# Patient Record
Sex: Male | Born: 1990 | Race: White | Hispanic: No | Marital: Single | Smoking: Never smoker
Health system: Southern US, Community
[De-identification: ages and names within clinical notes are randomized; demographics above are authoritative.]

## PROBLEM LIST (undated history)

## (undated) DIAGNOSIS — F419 Anxiety disorder, unspecified: Secondary | ICD-10-CM

## (undated) DIAGNOSIS — K219 Gastro-esophageal reflux disease without esophagitis: Secondary | ICD-10-CM

## (undated) HISTORY — PX: TONSILLECTOMY: SUR1361

## (undated) HISTORY — PX: NO PAST SURGERIES: SHX2092

---
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
## 2011-06-22 ENCOUNTER — Ambulatory Visit: Payer: Self-pay | Admitting: Internal Medicine

## 2011-06-22 LAB — RAPID STREP-A WITH REFLX: Micro Text Report: NEGATIVE

## 2011-06-24 LAB — BETA STREP CULTURE(ARMC)

## 2015-01-18 DIAGNOSIS — Z87898 Personal history of other specified conditions: Secondary | ICD-10-CM | POA: Insufficient documentation

## 2015-01-18 DIAGNOSIS — R0789 Other chest pain: Secondary | ICD-10-CM | POA: Insufficient documentation

## 2016-05-23 ENCOUNTER — Other Ambulatory Visit: Payer: Self-pay | Admitting: Family Medicine

## 2016-05-23 DIAGNOSIS — R748 Abnormal levels of other serum enzymes: Secondary | ICD-10-CM

## 2016-05-29 ENCOUNTER — Ambulatory Visit
Admission: RE | Admit: 2016-05-29 | Discharge: 2016-05-29 | Disposition: A | Discharge status: Home / Self Care | Payer: BLUE CROSS/BLUE SHIELD | Source: Ambulatory Visit | Attending: Family Medicine | Admitting: Family Medicine

## 2016-05-29 DIAGNOSIS — R748 Abnormal levels of other serum enzymes: Secondary | ICD-10-CM | POA: Insufficient documentation

## 2016-05-29 DIAGNOSIS — R932 Abnormal findings on diagnostic imaging of liver and biliary tract: Secondary | ICD-10-CM | POA: Diagnosis not present

## 2017-03-28 ENCOUNTER — Encounter: Payer: Self-pay | Admitting: Gastroenterology

## 2017-03-28 ENCOUNTER — Ambulatory Visit: Payer: BLUE CROSS/BLUE SHIELD | Admitting: Gastroenterology

## 2017-03-28 ENCOUNTER — Other Ambulatory Visit: Payer: Self-pay

## 2017-03-28 ENCOUNTER — Encounter (INDEPENDENT_AMBULATORY_CARE_PROVIDER_SITE_OTHER): Payer: Self-pay

## 2017-03-28 VITALS — BP 175/90 | HR 93 | Temp 97.8°F | Ht 76.0 in | Wt 248.0 lb

## 2017-03-28 DIAGNOSIS — R748 Abnormal levels of other serum enzymes: Secondary | ICD-10-CM | POA: Diagnosis not present

## 2017-03-28 NOTE — Progress Notes (Signed)
Gastroenterology Consultation  Referring Provider:     Marina GoodellFeldpausch, Dale E, MD Primary Care Physician:  Marina GoodellFeldpausch, Dale E, MD Primary Gastroenterologist:  Dr. Servando SnareWohl     Reason for Consultation:     Abnormal liver enzymes        HPI:   Jonathon Norris is a 26 y.o. y/o male referred for consultation & management of abnormal liver enzymes by Dr. Maryjane HurterFeldpausch, Madaline Guthrieale E, MD.  This patient was [facility] and was found to have abnormal liver enzymes. The patient's AST was higher than the ALT with the patient's AST being over 500. The patient was also found to have bilirubin that was over 2. The GI department was consult today and recommended the patient abstain from alcohol prior to having any further liver enzymes tested. The patient's acute hepatitis panel was negative for hepatitis B and C. The patient's hepatitis C was also negative. The patient's platelets have been normal and his albumin was not found to be low. The patient's hepatitis B surface antibody was positive conferring immunity. The patient had an ultrasound that showed increased echogenicity consistent with fatty liver. The patient had gone to the ER because of an anxiety attack. The patient had also had some vomiting prior to [facility]. The patient reports he was drinking a half-gallon of vodka a week and stop doing that.  The patient continues to drink 6-8 beers a day.  No past medical history on file.  No past surgical history on file.  Prior to Admission medications   Not on File    No family history on file.   Social History   Tobacco Use  . Smoking status: Not on file  Substance Use Topics  . Alcohol use: Not on file  . Drug use: Not on file    Allergies as of 03/28/2017  . (Not on File)    Review of Systems:    All systems reviewed and negative except where noted in HPI.   Physical Exam:  There were no vitals taken for this visit. No LMP for male patient. Psych:  Alert and cooperative.  Normal mood and affect. General:   Alert,  Well-developed, well-nourished, pleasant and cooperative in NAD Head:  Normocephalic and atraumatic. Eyes:  Sclera clear, no icterus.   Conjunctiva pink. Ears:  Normal auditory acuity. Nose:  No deformity, discharge, or lesions. Mouth:  No deformity or lesions,oropharynx pink & moist. Neck:  Supple; no masses or thyromegaly. Lungs:  Respirations even and unlabored.  Clear throughout to auscultation.   No wheezes, crackles, or rhonchi. No acute distress. Heart:  Regular rate and rhythm; no murmurs, clicks, rubs, or gallops. Abdomen:  Normal bowel sounds.  No bruits.  Soft, non-tender and non-distended without masses, hepatosplenomegaly or hernias noted.  No guarding or rebound tenderness.  Negative Carnett sign.   Rectal:  Deferred.  Msk:  Symmetrical without gross deformities.  Good, equal movement & strength bilaterally. Pulses:  Normal pulses noted. Extremities:  No clubbing or edema.  No cyanosis. Neurologic:  Alert and oriented x3;  grossly normal neurologically. Skin:  Intact without significant lesions or rashes.  No jaundice. Lymph Nodes:  No significant cervical adenopathy. Psych:  Alert and cooperative. Normal mood and affect.  Imaging Studies: No results found.  Assessment and Plan:   Jonathon JockRyan T Seydel is a 26 y.o. y/o male who comes in after being found to have abnormal liver enzymes.  The patient's most recent liver enzymes  have come down but he continues to drink. The patient was drinking a half of a gallon of vodka per week and 6-8 beers a day.  The patient has quit the vodka but continues to drink beer.  The patient has been told that he needs to stop all of his alcohol consumption and have a repeat blood test for his liver enzymes in 3 months.  The patient will also have his liver enzymes checked for other possible causes of abnormal liver enzymes in case he has something other than alcoholic hepatitis.  The patient has been explained the  plan and agrees with it.  Midge Miniumarren Eriona Kinchen, MD. Clementeen GrahamFACG   Note: This dictation was prepared with Dragon dictation along with smaller phrase technology. Any transcriptional errors that result from this process are unintentional.

## 2017-03-28 NOTE — Patient Instructions (Addendum)
You are scheduled for a RUQ abdominal US at [facility] on Nov. 13th @8 :45am. Please arrive at 8:30am. You cannot have anything to eat or drink after midnight.   If you need to reschedule this appointment for any reason, please contact central scheduling at [phone].

## 2017-03-29 LAB — ALPHA-1-ANTITRYPSIN: A-1 Antitrypsin: 192 mg/dL (ref 90–200)

## 2017-03-29 LAB — CERULOPLASMIN: CERULOPLASMIN: 27.6 mg/dL (ref 16.0–31.0)

## 2017-03-29 LAB — MITOCHONDRIAL ANTIBODIES: MITOCHONDRIAL AB: 3.6 U (ref 0.0–20.0)

## 2017-03-29 LAB — HEPATIC FUNCTION PANEL
ALBUMIN: 4.9 g/dL (ref 3.5–5.5)
ALK PHOS: 122 IU/L — AB (ref 39–117)
ALT: 155 IU/L — ABNORMAL HIGH (ref 0–44)
AST: 178 IU/L — AB (ref 0–40)
BILIRUBIN TOTAL: 0.9 mg/dL (ref 0.0–1.2)
Bilirubin, Direct: 0.3 mg/dL (ref 0.00–0.40)
Total Protein: 7.9 g/dL (ref 6.0–8.5)

## 2017-03-29 LAB — ANA: ANA: NEGATIVE

## 2017-03-29 LAB — ANTI-SMOOTH MUSCLE ANTIBODY, IGG: Smooth Muscle Ab: 10 Units (ref 0–19)

## 2017-04-01 ENCOUNTER — Telehealth: Payer: Self-pay

## 2017-04-01 NOTE — Telephone Encounter (Signed)
Pt notified of lab results

## 2017-04-01 NOTE — Telephone Encounter (Signed)
-----   Message from Midge Miniumarren Wohl, MD sent at 04/01/2017  2:01 PM EST ----- Let the patient know that his liver enzymes are still significantly elevated and he should have them checked again in a month after complete stopping of his drinking.

## 2017-04-02 ENCOUNTER — Ambulatory Visit
Admission: RE | Admit: 2017-04-02 | Discharge: 2017-04-02 | Disposition: A | Discharge status: Home / Self Care | Payer: BLUE CROSS/BLUE SHIELD | Source: Ambulatory Visit | Attending: Gastroenterology | Admitting: Gastroenterology

## 2017-04-02 DIAGNOSIS — K760 Fatty (change of) liver, not elsewhere classified: Secondary | ICD-10-CM | POA: Insufficient documentation

## 2017-04-02 DIAGNOSIS — R748 Abnormal levels of other serum enzymes: Secondary | ICD-10-CM | POA: Diagnosis not present

## 2017-04-08 ENCOUNTER — Telehealth: Payer: Self-pay | Admitting: Gastroenterology

## 2017-04-08 ENCOUNTER — Telehealth: Payer: Self-pay

## 2017-04-08 NOTE — Telephone Encounter (Signed)
Pt notified of US results.  

## 2017-04-08 NOTE — Telephone Encounter (Signed)
-----   Message from Midge Miniumarren Wohl, MD sent at 04/02/2017  6:00 PM EST ----- Left patient know that his ultrasound showed fatty liver which can be reversible in time.  No sign of cirrhosis or masses.

## 2017-04-08 NOTE — Telephone Encounter (Signed)
Patient is calling for lab results.

## 2018-05-30 ENCOUNTER — Other Ambulatory Visit: Payer: Self-pay

## 2018-05-30 ENCOUNTER — Ambulatory Visit
Admission: EM | Admit: 2018-05-30 | Discharge: 2018-05-30 | Disposition: A | Discharge status: Home / Self Care | Payer: Managed Care, Other (non HMO) | Attending: Family Medicine | Admitting: Family Medicine

## 2018-05-30 ENCOUNTER — Ambulatory Visit (INDEPENDENT_AMBULATORY_CARE_PROVIDER_SITE_OTHER): Payer: Managed Care, Other (non HMO)

## 2018-05-30 ENCOUNTER — Encounter: Payer: Self-pay | Admitting: Emergency Medicine

## 2018-05-30 DIAGNOSIS — M25572 Pain in left ankle and joints of left foot: Secondary | ICD-10-CM

## 2018-05-30 HISTORY — DX: Anxiety disorder, unspecified: F41.9

## 2018-05-30 MED ORDER — MELOXICAM 15 MG PO TABS: 15.0000 mg | ORAL_TABLET | Freq: Every day | ORAL | 0 refills | Status: DC | PRN

## 2018-05-30 NOTE — ED Triage Notes (Signed)
Patient c/o left ankle pain after twisting his ankle on 04/3018.

## 2018-05-30 NOTE — Discharge Instructions (Signed)
No fracture.  Medication as prescribed.  Wear brace.  If persists, see podiatry at [facility].

## 2018-05-30 NOTE — ED Provider Notes (Signed)
[facility]    CSN: [id number] Arrival date & time: 05/30/18  1159  History   Chief Complaint Chief Complaint  Patient presents with  . Ankle Pain    left   HPI  28 year old male presents with a left ankle injury.  Patient states that he twisted his ankle on 12/30.  Patient states that he had an eversion injury while lifting furniture at work.  Patient reports that his pain is located on the medial aspect of his left ankle.  He has been able to work.  He did take some time off to treat this conservatively in hopes that it would improve.  He returned to work earlier this week and had difficulty doing he is work secondary to pain.  Worse with certain activities.  Also worse early in the morning until he gets going.  Stiff.  Decreased range of motion.  No reports of swelling.  No bruising.  No other associated symptoms.  He has been using RICE without resolution.  No other complaints.  PMH, Surgical Hx, Family Hx, Social History reviewed and updated as below.  Past Medical History:  Diagnosis Date  . Anxiety    Patient Active Problem List   Diagnosis Date Noted  . Atypical chest pain 01/18/2015  . History of tachycardia 01/18/2015   History reviewed. No pertinent surgical history.  Home Medications    Prior to Admission medications   Medication Sig Start Date End Date Taking? Authorizing Provider  pantoprazole (PROTONIX) 40 MG tablet  03/21/17  Yes [provider]  sertraline (ZOLOFT) 100 MG tablet  03/21/17  Yes [provider]  meloxicam (MOBIC) 15 MG tablet Take 1 tablet (15 mg total) by mouth daily as needed. 05/30/18   Tommie Sams, DO   Family History Family History  Problem Relation Age of Onset  . Healthy Mother   . Healthy Father    Social History Social History   Tobacco Use  . Smoking status: Never Smoker  . Smokeless tobacco: Current User    Types: Snuff  Substance Use Topics  . Alcohol use: Yes  . Drug use: No   Allergies    Amoxicillin  Review of Systems Review of Systems  Constitutional: Negative.   Musculoskeletal:       Left ankle pain.    Physical Exam Triage Vital Signs ED Triage Vitals  Enc Vitals Group     BP 05/30/18 1225 (!) 139/97     Pulse Rate 05/30/18 1225 73     Resp 05/30/18 1225 16     Temp 05/30/18 1225 98 F (36.7 C)     Temp Source 05/30/18 1225 Oral     SpO2 05/30/18 1225 100 %     Weight 05/30/18 1221 240 lb (108.9 kg)     Height 05/30/18 1221 6\' 4"  (1.93 m)     Head Circumference --      Peak Flow --      Pain Score 05/30/18 1221 7     Pain Loc --      Pain Edu? --      Excl. in GC? --    Updated Vital Signs BP (!) 139/97 (BP Location: Left Arm)   Pulse 73   Temp 98 F (36.7 C) (Oral)   Resp 16   Ht 6\' 4"  (1.93 m)   Wt 108.9 kg   SpO2 100%   BMI 29.21 kg/m   Visual Acuity Right Eye Distance:   Left Eye Distance:  Bilateral Distance:    Right Eye Near:   Left Eye Near:    Bilateral Near:     Physical Exam Vitals signs and nursing note reviewed.  Constitutional:      General: He is not in acute distress. HENT:     Head: Normocephalic and atraumatic.  Cardiovascular:     Rate and Rhythm: Normal rate and regular rhythm.  Pulmonary:     Effort: Pulmonary effort is normal.     Breath sounds: No wheezing, rhonchi or rales.  Musculoskeletal:     Comments: Left ankle -tenderness around the medial malleolus. No lateral tenderness.  No visible swelling.  No bruising.  Neurological:     Mental Status: He is alert.  Psychiatric:        Mood and Affect: Mood normal.        Behavior: Behavior normal.    UC Treatments / Results  Labs (all labs ordered are listed, but only abnormal results are displayed) Labs Reviewed - No data to display  EKG None  Radiology Dg Ankle Complete Left  Result Date: 05/30/2018 CLINICAL DATA:  Pain after rolling injury EXAM: LEFT ANKLE COMPLETE - 3+ VIEW COMPARISON:  None. FINDINGS: Frontal, oblique, and lateral views  were obtained. No evident fracture or joint effusion. The joint spaces appear unremarkable. No erosive change. Ankle mortise appears intact. IMPRESSION: No fracture or evident arthropathy.  Ankle mortise appears intact. Electronically Signed   By: Bretta BangWilliam  Woodruff III M.D.   On: 05/30/2018 12:40    Procedures Procedures (including critical care time)  Medications Ordered in UC Medications - No data to display  Initial Impression / Assessment and Plan / UC Course  I have reviewed the triage vital signs and the nursing notes.  Pertinent labs & imaging results that were available during my care of the patient were reviewed by me and considered in my medical decision making (see chart for details).    28 year old male presents with left ankle pain.  No evidence of fracture.  X-ray was negative.  Placed in a ankle brace for comfort.  Meloxicam as directed.  Final Clinical Impressions(s) / UC Diagnoses   Final diagnoses:  Acute left ankle pain     Discharge Instructions     No fracture.  Medication as prescribed.  Wear brace.  If persists, [facility].   ED Prescriptions    Medication Sig Dispense Auth. Provider   meloxicam (MOBIC) 15 MG tablet Take 1 tablet (15 mg total) by mouth daily as needed. 30 tablet Tommie Samsook, Rhegan Trunnell G, DO     Controlled Substance Prescriptions [place] Controlled Substance Registry consulted? Not Applicable   Tommie SamsCook, Amparo Donalson G, DO 05/30/18 1418

## 2018-07-16 ENCOUNTER — Ambulatory Visit
Admission: EM | Admit: 2018-07-16 | Discharge: 2018-07-16 | Disposition: A | Discharge status: Home / Self Care | Payer: Managed Care, Other (non HMO) | Attending: Family Medicine | Admitting: Family Medicine

## 2018-07-16 ENCOUNTER — Encounter: Payer: Self-pay | Admitting: Emergency Medicine

## 2018-07-16 ENCOUNTER — Other Ambulatory Visit: Payer: Self-pay

## 2018-07-16 ENCOUNTER — Ambulatory Visit (INDEPENDENT_AMBULATORY_CARE_PROVIDER_SITE_OTHER): Payer: Managed Care, Other (non HMO)

## 2018-07-16 DIAGNOSIS — J069 Acute upper respiratory infection, unspecified: Secondary | ICD-10-CM

## 2018-07-16 DIAGNOSIS — R0981 Nasal congestion: Secondary | ICD-10-CM

## 2018-07-16 DIAGNOSIS — R05 Cough: Secondary | ICD-10-CM

## 2018-07-16 DIAGNOSIS — J0100 Acute maxillary sinusitis, unspecified: Secondary | ICD-10-CM

## 2018-07-16 DIAGNOSIS — M79672 Pain in left foot: Secondary | ICD-10-CM

## 2018-07-16 DIAGNOSIS — S93602A Unspecified sprain of left foot, initial encounter: Secondary | ICD-10-CM | POA: Diagnosis not present

## 2018-07-16 MED ORDER — DOXYCYCLINE HYCLATE 100 MG PO CAPS: 100.0000 mg | ORAL_CAPSULE | Freq: Two times a day (BID) | ORAL | 0 refills | Status: DC

## 2018-07-16 NOTE — Discharge Instructions (Signed)
Take medication as prescribed as discussed. Rest. Drink plenty of fluids. Continue supportive care.   Follow up with your primary care physician this week as needed. Return to Urgent care for new or worsening concerns.

## 2018-07-16 NOTE — ED Provider Notes (Signed)
[facility] ____________________________________________  Time seen: Approximately 3:40 PM  I have reviewed the triage vital signs and the nursing notes.   HISTORY  Chief Complaint Cough (appt) and Foot Pain (left)   HPI Jonathon Norris is a 28 y.o. male presenting for evaluation of 1 week of runny nose, nasal congestion, intermittent sinus pressure and some coughing.  Denies known fevers.  Denies sore throat.  States sinus pressure is currently mild in bilateral cheeks.  Denies ear pain.  Sick contacts at work.  Has been taken over-the-counter Alka-Seltzer and vitamin C which does tend to help some.  Denies chest pain, shortness of breath or abdominal pain.  Denies other aggravating leaving factors.  Patient also reports 2 months ago he accidentally internally rotated his left ankle and was seen in urgent care.  States that time the x-ray was negative.  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  States his ankle is feeling much better, but started to notice some pain in the middle of his left foot and his left toe intermittently.  Denies any other injury.  Has continued remain ambulatory.  States minimal pain to left foot currently.  States pain does fully resolve with ibuprofen and rest.  Denies pain radiation, paresthesias, or other pain.  Denies other complaints.  Marina Goodell, MD: PCP   Past Medical History:  Diagnosis Date  . Anxiety   . Anxiety     Patient Active Problem List   Diagnosis Date Noted  . Atypical chest pain 01/18/2015  . History of tachycardia 01/18/2015    Past Surgical History:  Procedure Laterality Date  . NO PAST SURGERIES       No current facility-administered medications for this encounter.   Current Outpatient Medications:  .  pantoprazole (PROTONIX) 40 MG tablet, , Disp: , Rfl: 1 .  sertraline (ZOLOFT) 100 MG tablet, , Disp: , Rfl: 1 .  doxycycline (VIBRAMYCIN) 100 MG  capsule, Take 1 capsule (100 mg total) by mouth 2 (two) times daily., Disp: 20 capsule, Rfl: 0  Allergies Amoxicillin  Family History  Problem Relation Age of Onset  . Healthy Mother   . Healthy Father     Social History Social History   Tobacco Use  . Smoking status: Never Smoker  . Smokeless tobacco: Current User    Types: Snuff  Substance Use Topics  . Alcohol use: Yes  . Drug use: No    Review of Systems Constitutional: No fever ENT: No sore throat.  Positive nasal congestion. Cardiovascular: Denies chest pain. Respiratory: Denies shortness of breath. Gastrointestinal: No abdominal pain.   Musculoskeletal: Negative for back pain.  Positive left foot pain as above. Skin: Negative for rash.   ____________________________________________   PHYSICAL EXAM:  VITAL SIGNS: ED Triage Vitals  Enc Vitals Group     BP 07/16/18 1248 (!) 146/112     Pulse Rate 07/16/18 1248 81     Resp 07/16/18 1248 18     Temp 07/16/18 1248 98.7 F (37.1 C)     Temp Source 07/16/18 1248 Oral     SpO2 07/16/18 1248 100 %     Weight 07/16/18 1244 245 lb (111.1 kg)     Height 07/16/18 1244 6\' 3"  (1.905 m)     Head Circumference --      Peak Flow --      Pain Score 07/16/18 1244 0  Pain Loc --      Pain Edu? --      Excl. in GC? --     Constitutional: Alert and oriented. Well appearing and in no acute distress. Eyes: Conjunctivae are normal.  Head: Atraumatic.Mild to moderate tenderness to palpation bilateral maxillary sinuses.  No frontal sinus tenderness palpation.  No swelling. No erythema.   Ears: no erythema, normal TMs bilaterally.   Nose: nasal congestion with bilateral nasal turbinate erythema and edema.   Mouth/Throat: Mucous membranes are moist.  Oropharynx non-erythematous.No tonsillar swelling or exudate.  Neck: No stridor.  No cervical spine tenderness to palpation. Hematological/Lymphatic/Immunilogical: No cervical lymphadenopathy. Cardiovascular: Normal rate,  regular rhythm. Grossly normal heart sounds.  Good peripheral circulation. Respiratory: Normal respiratory effort.  No retractions. No wheezes, rales or rhonchi. Good air movement.  Musculoskeletal:  Bilateral pedal pulses equal and easily palpated.  Steady gait. Except: Left ankle nontender, left medial dorsal foot to left first MTP joint mild tenderness direct palpation, slight edema, no ecchymosis, skin intact, full range of motion present to left foot and ankle, normal sensation capillary refill, no erythema.  Left lower extremity otherwise nontender.  No lower extremity edema noted bilaterally. Neurologic:  Normal speech and language. No gross focal neurologic deficits are appreciated. No gait instability. Skin:  Skin is warm, dry and intact. No rash noted. Psychiatric: Mood and affect are normal. Speech and behavior are normal. ___________________________________________   LABS (all labs ordered are listed, but only abnormal results are displayed)  Labs Reviewed - No data to display  RADIOLOGY  Dg Foot Complete Left  Result Date: 07/16/2018 CLINICAL DATA:  Left foot pain after injury 2 months ago. EXAM: LEFT FOOT - COMPLETE 3+ VIEW COMPARISON:  None. FINDINGS: There is no evidence of fracture or dislocation. There is no evidence of arthropathy or other focal bone abnormality. Soft tissues are unremarkable. IMPRESSION: Negative. Electronically Signed   By: Lupita Raider, M.D.   On: 07/16/2018 13:37   ____________________________________________   PROCEDURES Procedures    INITIAL IMPRESSION / ASSESSMENT AND PLAN / ED COURSE  Pertinent labs & imaging results that were available during my care of the patient were reviewed by me and considered in my medical decision making (see chart for details).  Well-appearing patient.  No acute distress.  Suspect recent viral upper respiratory infection, possible sinusitis.  Encourage continue over-the-counter supportive care for 2-3 more days,  Rx hardcopy doxycycline given to begin if symptoms continue.  Reevaluation for worsening complaints.  Left foot x-ray as above per radiologist, negative.  Suspect sprain injury.  Continue supportive care, follow-up with podiatry as needed.Discussed indication, risks and benefits of medications with patient.  Discussed follow up with Primary care physician this week. Discussed follow up and return parameters including no resolution or any worsening concerns. Patient verbalized understanding and agreed to plan.   ____________________________________________   FINAL CLINICAL IMPRESSION(S) / ED DIAGNOSES  Final diagnoses:  Upper respiratory tract infection, unspecified type  Acute maxillary sinusitis, recurrence not specified  Foot sprain, left, initial encounter     ED Discharge Orders         Ordered    doxycycline (VIBRAMYCIN) 100 MG capsule  2 times daily,   Status:  Discontinued     07/16/18 1410    doxycycline (VIBRAMYCIN) 100 MG capsule  2 times daily     07/16/18 1412           Note: This dictation was prepared with Dragon dictation along with smaller  Lobbyist. Any transcriptional errors that result from this process are unintentional.         Renford Dills, NP 07/16/18 1715

## 2018-07-16 NOTE — ED Triage Notes (Signed)
Pt c/o productive cough, chest congestion, sinus congestion. Started about a week ago. Cough is keeping him up at night. Also he is having pain in his left ankle and foot. He was seen for this back in January but is not better and the pain has moved into his foot/toe.

## 2019-01-27 ENCOUNTER — Encounter: Payer: Self-pay | Admitting: Emergency Medicine

## 2019-01-27 ENCOUNTER — Ambulatory Visit
Admission: EM | Admit: 2019-01-27 | Discharge: 2019-01-27 | Disposition: A | Discharge status: Home / Self Care | Payer: Managed Care, Other (non HMO) | Attending: Emergency Medicine | Admitting: Emergency Medicine

## 2019-01-27 ENCOUNTER — Other Ambulatory Visit: Payer: Self-pay

## 2019-01-27 DIAGNOSIS — J019 Acute sinusitis, unspecified: Secondary | ICD-10-CM

## 2019-01-27 DIAGNOSIS — Z7189 Other specified counseling: Secondary | ICD-10-CM

## 2019-01-27 HISTORY — DX: Gastro-esophageal reflux disease without esophagitis: K21.9

## 2019-01-27 LAB — RAPID STREP SCREEN (MED CTR MEBANE ONLY): Streptococcus, Group A Screen (Direct): NEGATIVE

## 2019-01-27 MED ORDER — DOXYCYCLINE HYCLATE 100 MG PO CAPS: 100.0000 mg | ORAL_CAPSULE | Freq: Two times a day (BID) | ORAL | 0 refills | Status: DC

## 2019-01-27 NOTE — Discharge Instructions (Addendum)
It was very nice seeing you today in clinic. Thank you for entrusting me with your care.   Rest and increase fluid intake as much as possible. Please utilize the medications that we discussed. Your prescriptions have been called in to your pharmacy.   You were tested for SARS-CoV-2 (novel coronavirus) today. Please remain out of work, per [facility] guidelines, until negative test results have been received.   Make arrangements to follow up with your regular doctor in 1 week for re-evaluation if not improving.  If your symptoms/condition worsens, please seek follow up care either here or in the ER. Please remember, our [place] providers are "right here with you" when you need [place].   Again, it was my pleasure to take care of you today. Thank you for choosing our clinic. I hope that you start to feel better quickly.   Honor Loh, MSN, APRN, FNP-C, CEN Advanced Practice Provider [facility]

## 2019-01-27 NOTE — ED Triage Notes (Signed)
Patient in today c/o sore throat, nasal congestion and headache (pressure) x 4 days. Patient denies fever.

## 2019-01-27 NOTE — ED Provider Notes (Signed)
[place], [place]   Name: Jonathon Norris DOB: July 08, 1990 MRN: [id number] CSN: [id number] PCP: Sofie Hartigan, MD  Arrival date and time:  01/27/19 1354  Chief Complaint:  Sore Throat (APPT) and Nasal Congestion   NOTE: Prior to seeing the patient today, I have reviewed the triage nursing documentation and vital signs. Clinical staff has updated patient's PMH/PSHx, current medication list, and drug allergies/intolerances to ensure comprehensive history available to assist in medical decision making.   History:   HPI: Jonathon Norris is a 28 y.o. male who presents today with complaints of congestion, headache, and sore throat that has been worsening over the course of the last week. Patient has not had any fevers, but notes temperatures in the upper 99 range. He had had a cough that has been productive of clear sputum. He has experienced significant paranasal sinus tenderness. Patient denies nausea, vomiting, and diarrhea. He has not been in close contact with anyone known to be ill. He reports that he has never been tested for SARS-CoV-2 (novel coronavirus). In efforts to conservatively manage his symptoms at home, the patient notes that he has used Nyquil, Mucinex, Alka-Seltzer Cold and Flu, and Emergen-C, none of which have helped to improve his symptoms significantly. Patient presents today stating that his symptoms have progressed and worsened since onset.   Past Medical History:  Diagnosis Date  . Anxiety   . Anxiety   . GERD (gastroesophageal reflux disease)     Past Surgical History:  Procedure Laterality Date  . TONSILLECTOMY      Family History  Problem Relation Age of Onset  . Healthy Mother   . Healthy Father     Social History   Tobacco Use  . Smoking status: Never Smoker  . Smokeless tobacco: Current User    Types: Snuff  Substance Use Topics  . Alcohol use: Yes    Comment: occassional  . Drug use: No    Patient Active Problem List   Diagnosis Date Noted  .  Atypical chest pain 01/18/2015  . History of tachycardia 01/18/2015    Home Medications:    Current Meds  Medication Sig  . omeprazole (PRILOSEC) 20 MG capsule Take 20 mg by mouth daily.  . sertraline (ZOLOFT) 100 MG tablet     Allergies:   Amoxicillin  Review of Systems (ROS): Review of Systems  Constitutional: Positive for fatigue. Negative for chills and fever (+) low grade temps in the upper range of 99s.  HENT: Positive for congestion, rhinorrhea, sinus pressure, sinus pain and sore throat. Negative for trouble swallowing.   Respiratory: Positive for cough. Negative for shortness of breath.   Cardiovascular: Negative for chest pain and palpitations.  Gastrointestinal: Negative for abdominal pain, diarrhea, nausea and vomiting.  Musculoskeletal: Negative for myalgias.  Skin: Negative for color change, pallor and rash.  Neurological: Positive for headaches. Negative for weakness.  Psychiatric/Behavioral: The patient is nervous/anxious.   All other systems reviewed and are negative.    Vital Signs: Today's Vitals   01/27/19 1413 01/27/19 1414 01/27/19 1500 01/27/19 1502  BP:  (!) 161/115 (!) 166/104   Pulse:      Resp:      Temp:      TempSrc:      SpO2:      Weight: 238 lb (108 kg)     Height: 6\' 3"  (1.905 m)     PainSc: 0-No pain   0-No pain    Physical Exam: Physical Exam  Constitutional: He  is oriented to person, place, and time and well-developed, well-nourished, and in no distress. No distress.  HENT:  Head: Normocephalic and atraumatic.  Right Ear: Hearing, tympanic membrane, external ear and ear canal normal.  Left Ear: Hearing, tympanic membrane, external ear and ear canal normal.  Nose: Mucosal edema, rhinorrhea and sinus tenderness present.  Mouth/Throat: Uvula is midline and mucous membranes are normal. Posterior oropharyngeal erythema present. No posterior oropharyngeal edema.  Eyes: Pupils are equal, round, and reactive to light. Conjunctivae and  EOM are normal.  Neck: Normal range of motion. Neck supple. No tracheal deviation present.  Cardiovascular: Normal rate, regular rhythm, normal heart sounds and intact distal pulses. Exam reveals no gallop and no friction rub.  No murmur heard. Pulmonary/Chest: Effort normal and breath sounds normal. No respiratory distress. He has no wheezes. He has no rales.  Musculoskeletal: Normal range of motion.  Lymphadenopathy:       Head (right side): Submandibular adenopathy present.       Head (left side): Submandibular adenopathy present.  Neurological: He is alert and oriented to person, place, and time. Gait normal.  Skin: Skin is warm and dry. No rash noted. He is not diaphoretic.  Psychiatric: Mood, memory, affect and judgment normal.  Nursing note and vitals reviewed.   Urgent Care Treatments / Results:   LABS: PLEASE NOTE: all labs that were ordered this encounter are listed, however only abnormal results are displayed. Labs Reviewed  RAPID STREP SCREEN ([facility] ONLY)  NOVEL CORONAVIRUS, NAA (HOSP ORDER, SEND-OUT TO REF LAB; TAT 18-24 HRS)  CULTURE, GROUP A STREP [facility])    EKG: -None  RADIOLOGY: No results found.  PROCEDURES: Procedures  MEDICATIONS RECEIVED THIS VISIT: Medications - No data to display  PERTINENT CLINICAL COURSE NOTES/UPDATES:   Initial Impression / Assessment and Plan / Urgent Care Course:  Pertinent labs & imaging results that were available during my care of the patient were personally reviewed by me and considered in my medical decision making (see lab/imaging section of note for values and interpretations).  Jonathon Norris is a 28 y.o. male who presents to [facility] today with complaints of Sore Throat (APPT) and Nasal Congestion  Patient overall well appearing and in no acute distress today in clinic. Presenting symptoms (see HPI) and exam as documented above. He presents with symptoms associated with SARS-CoV-2 (novel coronavirus).  Discussed typical symptom constellation. Reviewed potential for infection and need for testing. Patient amenable to being tested. SARS-CoV-2 swab collected by certified clinical staff. Discussed variable turn around times associated with testing, as swabs are being processed at [facility], and have been taking between 2-5 days to come back. He was advised to self quarantine, per [facility] DHHS guidelines, until negative results received.   Rapid streptococcal throat swab negative; reflex culture sent. Symptoms consistent with acute sinusitis. Patient has failed conservative management with OTC mucolytics and decongestants. Patient feels worse and reports that symptoms feel like they are progressing. Patient's most distress symptom is the facial pressure/pain and headache. PMH (+) for bi-annual episodes of sinusitis; last treated in 06/2018 with ABX. He is allergic to PCN. Will treat with a 7 day course of doxycycline. Dicussed supportive care measures at home during acute phase of illness. Patient to rest as much as possible. He was encouraged to ensure adequate hydration (water and ORS) to prevent dehydration and electrolyte derangements. Patient may use APAP and/or IBU on an as needed basis for pain/fever. May use Tylenol and/or Ibuprofen as needed  for pain/fever.   Current clinical condition warrants patient being out of work in order to recover from her current injury/illness. He was provided with the appropriate documentation to provide to his place of employment that will allow for him to RTW on 01/29/2019 with no restrictions. RTW is contingent on his SARS-CoV-2 test results being reviewed as negative.    Discussed follow up with primary care physician in 1 week for re-evaluation. I have reviewed the follow up and strict return precautions for any new or worsening symptoms. Patient is aware of symptoms that would be deemed urgent/emergent, and would thus require further evaluation either here or in the emergency  department. At the time of discharge, he verbalized understanding and consent with the discharge plan as it was reviewed with him. All questions were fielded by provider and/or clinic staff prior to patient discharge.    Final Clinical Impressions / Urgent Care Diagnoses:   Final diagnoses:  Acute non-recurrent sinusitis, unspecified location  Advice Given About Covid-19 Virus Infection    New Prescriptions:  [place] Controlled Substance Registry consulted? Not Applicable  Meds ordered this encounter  Medications  . doxycycline (VIBRAMYCIN) 100 MG capsule    Sig: Take 1 capsule (100 mg total) by mouth 2 (two) times daily.    Dispense:  14 capsule    Refill:  0    Recommended Follow up Care:  Patient encouraged to follow up with the following provider within the specified time frame, or sooner as dictated by the severity of his symptoms. As always, he was instructed that for any urgent/emergent care needs, he should seek care either here or in the emergency department for more immediate evaluation.  Follow-up Information    Feldpausch, Madaline Guthrieale E, MD In 1 week.   Specialty: Family Medicine Why: General reassessment of symptoms if not improving Contact information: [address] [place] [id number] [phone]         NOTE: This note was prepared using Dragon dictation software along with smaller phrase technology. Despite my best ability to proofread, there is the potential that transcriptional errors may still occur from this process, and are completely unintentional.     Verlee MonteGray, Darryon Bastin E, NP 01/27/19 2211

## 2019-01-28 LAB — NOVEL CORONAVIRUS, NAA (HOSP ORDER, SEND-OUT TO REF LAB; TAT 18-24 HRS): SARS-CoV-2, NAA: NOT DETECTED

## 2019-01-29 ENCOUNTER — Encounter (HOSPITAL_COMMUNITY): Payer: Self-pay

## 2019-01-30 LAB — CULTURE, GROUP A STREP (THRC)

## 2019-03-14 ENCOUNTER — Other Ambulatory Visit: Payer: Self-pay

## 2019-03-14 ENCOUNTER — Encounter: Payer: Self-pay | Admitting: Emergency Medicine

## 2019-03-14 ENCOUNTER — Ambulatory Visit
Admission: EM | Admit: 2019-03-14 | Discharge: 2019-03-14 | Disposition: A | Discharge status: Home / Self Care | Payer: 59 | Attending: Emergency Medicine | Admitting: Emergency Medicine

## 2019-03-14 DIAGNOSIS — Z23 Encounter for immunization: Secondary | ICD-10-CM | POA: Diagnosis not present

## 2019-03-14 DIAGNOSIS — S91132A Puncture wound without foreign body of left great toe without damage to nail, initial encounter: Secondary | ICD-10-CM | POA: Diagnosis not present

## 2019-03-14 DIAGNOSIS — W450XXA Nail entering through skin, initial encounter: Secondary | ICD-10-CM

## 2019-03-14 MED ORDER — TETANUS-DIPHTH-ACELL PERTUSSIS 5-2.5-18.5 LF-MCG/0.5 IM SUSP
0.5000 mL | Freq: Once | INTRAMUSCULAR | Status: AC
  Administered 2019-03-14: 0.5 mL via INTRAMUSCULAR

## 2019-03-14 NOTE — ED Provider Notes (Signed)
[facility] [place] [place]   Name: DETAVIOUS RINN DOB: 07/14/1990 MRN: [id number] CSN: [id number] PCP: Sofie Hartigan, MD  Arrival date and time:  03/14/19 1049  Chief Complaint:  stepped on nail   NOTE: Prior to seeing the patient today, I have reviewed the triage nursing documentation and vital signs. Clinical staff has updated patient's PMH/PSHx, current medication list, and drug allergies/intolerances to ensure comprehensive history available to assist in medical decision making.   History:   HPI: SYDNEY AZURE is a 28 y.o. male who presents today with complaints of a puncture wound to his big toe. 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 Immediately after the injury, he cleaned the area with peroxide. No complications to the area since the injury, but he is requesting a tetanus update.    Past Medical History:  Diagnosis Date  . Anxiety   . Anxiety   . GERD (gastroesophageal reflux disease)     Past Surgical History:  Procedure Laterality Date  . TONSILLECTOMY      Family History  Problem Relation Age of Onset  . Healthy Mother   . Healthy Father     Social History   Tobacco Use  . Smoking status: Never Smoker  . Smokeless tobacco: Current User    Types: Snuff  Substance Use Topics  . Alcohol use: Yes    Comment: occassional  . Drug use: No    Patient Active Problem List   Diagnosis Date Noted  . Atypical chest pain 01/18/2015  . History of tachycardia 01/18/2015    Home Medications:    Current Meds  Medication Sig  . omeprazole (PRILOSEC) 20 MG capsule Take 20 mg by mouth daily.  . sertraline (ZOLOFT) 100 MG tablet     Allergies:   Amoxicillin  Review of Systems (ROS): Review of Systems  Skin: Positive for wound.  All other systems reviewed and are negative.    Vital Signs: Today's Vitals   03/14/19 1100 03/14/19 1101 03/14/19 1116  BP: 126/83    Pulse: 72    Resp: 18    Temp: 97.8 F  (36.6 C)    TempSrc: Oral    SpO2: 99%    Weight:  238 lb (108 kg)   Height:  6\' 4"  (1.93 m)   PainSc: 3   3     Physical Exam: Physical Exam Vitals signs and nursing note reviewed.  Constitutional:      Appearance: Normal appearance.  Skin:    General: Skin is warm.     Findings: Wound present.     Comments: Puncture wound to left great toe. No bleeding, bruising or further complications noted.   Neurological:     Mental Status: He is alert.      Urgent Care Treatments / Results:   LABS: PLEASE NOTE: all labs that were ordered this encounter are listed, however only abnormal results are displayed. Labs Reviewed - No data to display  EKG: -None  RADIOLOGY: No results found.  PROCEDURES: Procedures  MEDICATIONS RECEIVED THIS VISIT: Medications  Tdap (BOOSTRIX) injection 0.5 mL (0.5 mLs Intramuscular Given 03/14/19 1109)    PERTINENT CLINICAL COURSE NOTES/UPDATES:   Initial Impression / Assessment and Plan / Urgent Care Course:  Pertinent labs & imaging results that were available during my care of the patient were personally reviewed by me and considered in my medical decision making (see lab/imaging section of note for values and  interpretations).  FAYSAL FENOGLIO is a 28 y.o. male who presents to [facility] today with complaints of puncture wound, diagnosed with a need for a [place] booster, and treated as such with the medications below. NP and patient reviewed discharge instructions below during visit.   Patient is well appearing overall in clinic today. He does not appear to be in any acute distress. Presenting symptoms (see HPI) and exam as documented above.   I have reviewed the follow up and strict return precautions for any new or worsening symptoms. Patient is aware of symptoms that would be deemed urgent/emergent, and would thus require further evaluation either here or in the emergency department. At the time of discharge, he verbalized  understanding and consent with the discharge plan as it was reviewed with him. All questions were fielded by provider and/or clinic staff prior to patient discharge.    Final Clinical Impressions / Urgent Care Diagnoses:   Final diagnoses:  Need for prophylactic vaccination with combined diphtheria-tetanus-pertussis (DTP) vaccine    New Prescriptions:  [place] Controlled Substance Registry consulted? Not Applicable  Meds ordered this encounter  Medications  . Tdap (BOOSTRIX) injection 0.5 mL      Discharge Instructions     You received a tetanus booster. You're covered until 2030 if you don't have any injuries (cuts, animal bites, insect stings, puncture wounds). If an injury happens after 2025, you will need another booster.      Recommended Follow up Care:  Patient encouraged to follow up with the following provider within the specified time frame, or sooner as dictated by the severity of his symptoms. As always, he was instructed that for any urgent/emergent care needs, he should seek care either here or in the emergency department for more immediate evaluation.   Bailey Mech, DNP, NP-c    Bailey Mech, NP 03/14/19 [phone]

## 2019-03-14 NOTE — ED Triage Notes (Signed)
Patient in today after stepping on a nail yesterday with left great toe. Patient's last tetanus is unknown.

## 2019-03-14 NOTE — Discharge Instructions (Addendum)
You received a tetanus booster. You're covered until 2030 if you don't have any injuries (cuts, animal bites, insect stings, puncture wounds). If an injury happens after 2025, you will need another booster.

## 2019-05-26 ENCOUNTER — Ambulatory Visit
Admission: EM | Admit: 2019-05-26 | Discharge: 2019-05-26 | Disposition: A | Discharge status: Home / Self Care | Payer: HRSA Program | Attending: Family Medicine | Admitting: Family Medicine

## 2019-05-26 ENCOUNTER — Encounter: Payer: Self-pay | Admitting: Emergency Medicine

## 2019-05-26 ENCOUNTER — Other Ambulatory Visit: Payer: Self-pay

## 2019-05-26 DIAGNOSIS — Z20822 Contact with and (suspected) exposure to covid-19: Secondary | ICD-10-CM | POA: Insufficient documentation

## 2019-05-26 NOTE — ED Provider Notes (Signed)
[facility]    CSN: [id number] Arrival date & time: 05/26/19  0836      History   Chief Complaint Chief Complaint  Patient presents with  . COVID testing   HPI  29 year old male presents with the above complaint.  Patient reports that over the weekend he had nausea and vomiting.  This subsequently resolved.  He informed his employer that he did not feel well and was not coming into work.  He advised him that he needs a Covid test before he can return to work.  Patient is currently feeling well and is asymptomatic.  He desires Covid testing today.  No documented fever.  No other associated symptoms.  No other complaints.  PMH, Surgical Hx, Family Hx, Social History reviewed and updated as below.  Past Medical History:  Diagnosis Date  . Anxiety   . Anxiety   . GERD (gastroesophageal reflux disease)    Patient Active Problem List   Diagnosis Date Noted  . Atypical chest pain 01/18/2015  . History of tachycardia 01/18/2015   Past Surgical History:  Procedure Laterality Date  . TONSILLECTOMY     Home Medications    Prior to Admission medications   Medication Sig Start Date End Date Taking? Authorizing Provider  omeprazole (PRILOSEC) 20 MG capsule Take 20 mg by mouth daily.   Yes [provider]  sertraline (ZOLOFT) 100 MG tablet  03/21/17  Yes [provider]  pantoprazole (PROTONIX) 40 MG tablet  03/21/17 01/27/19  [provider]    Family History Family History  Problem Relation Age of Onset  . Healthy Mother   . Healthy Father     Social History Social History   Tobacco Use  . Smoking status: Never Smoker  . Smokeless tobacco: Current User    Types: Snuff  Substance Use Topics  . Alcohol use: Yes    Comment: occassional  . Drug use: No     Allergies   Amoxicillin   Review of Systems Review of Systems  Constitutional: Negative.   Gastrointestinal: Positive for nausea and vomiting.   Physical Exam Triage  Vital Signs ED Triage Vitals  Enc Vitals Group     BP 05/26/19 0852 (!) 163/99     Pulse Rate 05/26/19 0852 69     Resp 05/26/19 0852 18     Temp 05/26/19 0852 98.5 F (36.9 C)     Temp Source 05/26/19 0852 Oral     SpO2 05/26/19 0852 99 %     Weight 05/26/19 0851 250 lb (113.4 kg)     Height 05/26/19 0851 6\' 3"  (1.905 m)     Head Circumference --      Peak Flow --      Pain Score 05/26/19 0851 0     Pain Loc --      Pain Edu? --      Excl. in GC? --    Updated Vital Signs BP (!) 163/99 (BP Location: Right Arm)   Pulse 69   Temp 98.5 F (36.9 C) (Oral)   Resp 18   Ht 6\' 3"  (1.905 m)   Wt 113.4 kg   SpO2 99%   BMI 31.25 kg/m   Visual Acuity Right Eye Distance:   Left Eye Distance:   Bilateral Distance:    Right Eye Near:   Left Eye Near:    Bilateral Near:     Physical Exam Vitals and nursing note reviewed.  Constitutional:      General:  He is not in acute distress.    Appearance: Normal appearance. He is not ill-appearing.  HENT:     Head: Normocephalic and atraumatic.  Eyes:     General:        Right eye: No discharge.        Left eye: No discharge.     Conjunctiva/sclera: Conjunctivae normal.  Cardiovascular:     Rate and Rhythm: Normal rate and regular rhythm.     Heart sounds: No murmur.  Pulmonary:     Effort: Pulmonary effort is normal.     Breath sounds: Normal breath sounds. No wheezing, rhonchi or rales.  Neurological:     Mental Status: He is alert.  Psychiatric:        Mood and Affect: Mood normal.        Behavior: Behavior normal.    UC Treatments / Results  Labs (all labs ordered are listed, but only abnormal results are displayed) Labs Reviewed  NOVEL CORONAVIRUS, NAA (HOSP ORDER, SEND-OUT TO REF LAB; TAT 18-24 HRS)   EKG  Radiology No results found.  Procedures Procedures (including critical care time)  Medications Ordered in UC Medications - No data to display  Initial Impression / Assessment and Plan / UC Course  I  have reviewed the triage vital signs and the nursing notes.  Pertinent labs & imaging results that were available during my care of the patient were reviewed by me and considered in my medical decision making (see chart for details).    29 year old male presents for COVID testing. Recent nausea and vomiting. Now asymptomatic. Awaiting test results. May return to work if negative.  Final Clinical Impressions(s) / UC Diagnoses   Final diagnoses:  Encounter for laboratory testing for COVID-19 virus     Discharge Instructions     Results available in 24 to 48 hours.  Stay home.  Take care  Dr. Lacinda Axon     ED Prescriptions    None     PDMP not reviewed this encounter.   Thersa Salt [place], [place] 05/26/19 [phone]

## 2019-05-26 NOTE — ED Triage Notes (Signed)
Patient here for COVID testing to return to work. He denies symptoms.

## 2019-05-26 NOTE — Discharge Instructions (Signed)
Results available in 24 to 48 hours.  Stay home.  Take care  Dr. Alberta Cairns   

## 2019-05-27 LAB — NOVEL CORONAVIRUS, NAA (HOSP ORDER, SEND-OUT TO REF LAB; TAT 18-24 HRS): SARS-CoV-2, NAA: NOT DETECTED

## 2019-12-07 ENCOUNTER — Ambulatory Visit: Payer: Self-pay

## 2019-12-07 ENCOUNTER — Ambulatory Visit (INDEPENDENT_AMBULATORY_CARE_PROVIDER_SITE_OTHER)
Admission: RE | Admit: 2019-12-07 | Discharge: 2019-12-07 | Disposition: A | Discharge status: Home / Self Care | Payer: BLUE CROSS/BLUE SHIELD | Source: Ambulatory Visit

## 2019-12-07 DIAGNOSIS — R112 Nausea with vomiting, unspecified: Secondary | ICD-10-CM | POA: Diagnosis not present

## 2019-12-07 NOTE — Discharge Instructions (Addendum)
Drink clear liquids  Once you are managing clear liquids, you may move to bland foods  Once you are managing bland foods, you may move to more fatty foods  If in person if your symptoms are persisting

## 2019-12-07 NOTE — ED Provider Notes (Signed)
[facility] CARE CENTER  Virtual Visit via Video Note:  DAMARIE SCHOOLFIELD  initiated request for Telemedicine visit with [facility]gent [facility] team. I connected with Stark Jock  on 12/07/2019 at 2:40 PM  for a synchronized telemedicine visit using a video enabled HIPPA compliant telemedicine application. I verified that I am speaking with Stark Jock  using two identifiers. Marykay Lex, NP  was physically located in a [facility] Urgent care site and BRENNYN HAISLEY was located at a different location.   The limitations of evaluation and management by telemedicine as well as the availability of in-person appointments were discussed. Patient was informed that he  may incur a bill ( including co-pay) for this virtual visit encounter. Stark Jock  expressed understanding and gave verbal consent to proceed with virtual visit.   [id number] 12/07/19 Arrival Time: 1435  CC: UPSET STOMACH SUBJECTIVE: History from: patient.  SPENCE SOBERANO is a 29 y.o. male who presents with abrupt onset of nausea and vomiting yesterday.  Reports episodes of nausea overnight.  Reports that he has been treating at home with clear fluids.  Reports that this has been working for him.  Reports that he did not sleep well last night and did not go to work today.  Requesting a work note for today.  Reports that symptoms have basically resolved at this point.  Denies sick exposure to strep, flu or mono, or precipitating event.  There are no aggravating factors.   Denies fever, chills, fatigue, ear pain, sinus pain, rhinorrhea, nasal congestion, cough, SOB, wheezing, chest pain, rash, changes in bowel or bladder habits.      ROS: As per HPI.  All other pertinent ROS negative.     Past Medical History:  Diagnosis Date  . Anxiety   . Anxiety   . GERD (gastroesophageal reflux disease)    Past Surgical History:  Procedure Laterality Date  . TONSILLECTOMY     Allergies  Allergen Reactions  . Amoxicillin Other (See  Comments)    unknown   No current facility-administered medications on file prior to encounter.   Current Outpatient Medications on File Prior to Encounter  Medication Sig Dispense Refill  . omeprazole (PRILOSEC) 20 MG capsule Take 20 mg by mouth daily.    . sertraline (ZOLOFT) 100 MG tablet   1  . [DISCONTINUED] pantoprazole (PROTONIX) 40 MG tablet   1   Social History   Socioeconomic History  . Marital status: Single    Spouse name: Not on file  . Number of children: Not on file  . Years of education: Not on file  . Highest education level: Not on file  Occupational History  . Not on file  Tobacco Use  . Smoking status: Never Smoker  . Smokeless tobacco: Current User    Types: Snuff  Vaping Use  . Vaping Use: Never used  Substance and Sexual Activity  . Alcohol use: Yes    Comment: occassional  . Drug use: No  . Sexual activity: Not on file  Other Topics Concern  . Not on file  Social History Narrative  . Not on file   Social Determinants of Health   Financial Resource Strain:   . Difficulty of Paying Living Expenses:   Food Insecurity:   . Worried About Programme researcher, broadcasting/film/video in the Last Year:   . Barista in the Last Year:   Transportation Needs:   . Freight forwarder (Medical):   Marland Kitchen  Lack of Transportation (Non-Medical):   Physical Activity:   . Days of Exercise per Week:   . Minutes of Exercise per Session:   Stress:   . Feeling of Stress :   Social Connections:   . Frequency of Communication with Friends and Family:   . Frequency of Social Gatherings with Friends and Family:   . Attends Religious Services:   . Active Member of Clubs or Organizations:   . Attends Banker Meetings:   Marland Kitchen Marital Status:   Intimate Partner Violence:   . Fear of Current or Ex-Partner:   . Emotionally Abused:   Marland Kitchen Physically Abused:   . Sexually Abused:    Family History  Problem Relation Age of Onset  . Healthy Mother   . Healthy Father      OBJECTIVE:   There were no vitals filed for this visit.  General appearance: alert; no distress Eyes: EOMI grossly HENT: normocephalic; atraumatic Neck: supple with FROM Lungs: normal respiratory effort; speaking in full sentences without difficulty Extremities: moves extremities without difficulty Skin: No obvious rashes Neurologic: No facial asymmetries Psychological: alert and cooperative; normal mood and affect  ASSESSMENT & PLAN:  1. Nausea and vomiting, intractability of vomiting not specified, unspecified vomiting type     No orders of the defined types were placed in this encounter.    Push fluids and get rest Drink clear liquids then move onto bland foods Once tolerating bland foods, may move onto more fluid Take OTC ibuprofen or tylenol as needed for pain Follow up with PCP if symptoms persists Return or go to ER if patient has any new or worsening symptoms such as fever, chills, nausea, vomiting, worsening sore throat, cough, abdominal pain, chest pain, changes in bowel or bladder habits  I discussed the assessment and treatment plan with the patient. The patient was provided an opportunity to ask questions and all were answered. The patient agreed with the plan and demonstrated an understanding of the instructions.   The patient was advised to call back or seek an in-person evaluation if the symptoms worsen or if the condition fails to improve as anticipated.  I provided 10 minutes of non-face-to-face time during this encounter.  Marykay Lex, NP  12/07/2019 2:40 PM         Moshe Cipro, NP 12/07/19 1440

## 2020-06-13 IMAGING — CR DG ANKLE COMPLETE 3+V*L*
3 series · 3 of 3 positions shown · non-contrast
Comparison: None.

CLINICAL DATA: Pain after rolling injury

EXAM:
LEFT ANKLE COMPLETE - 3+ VIEW

[ankle ap]
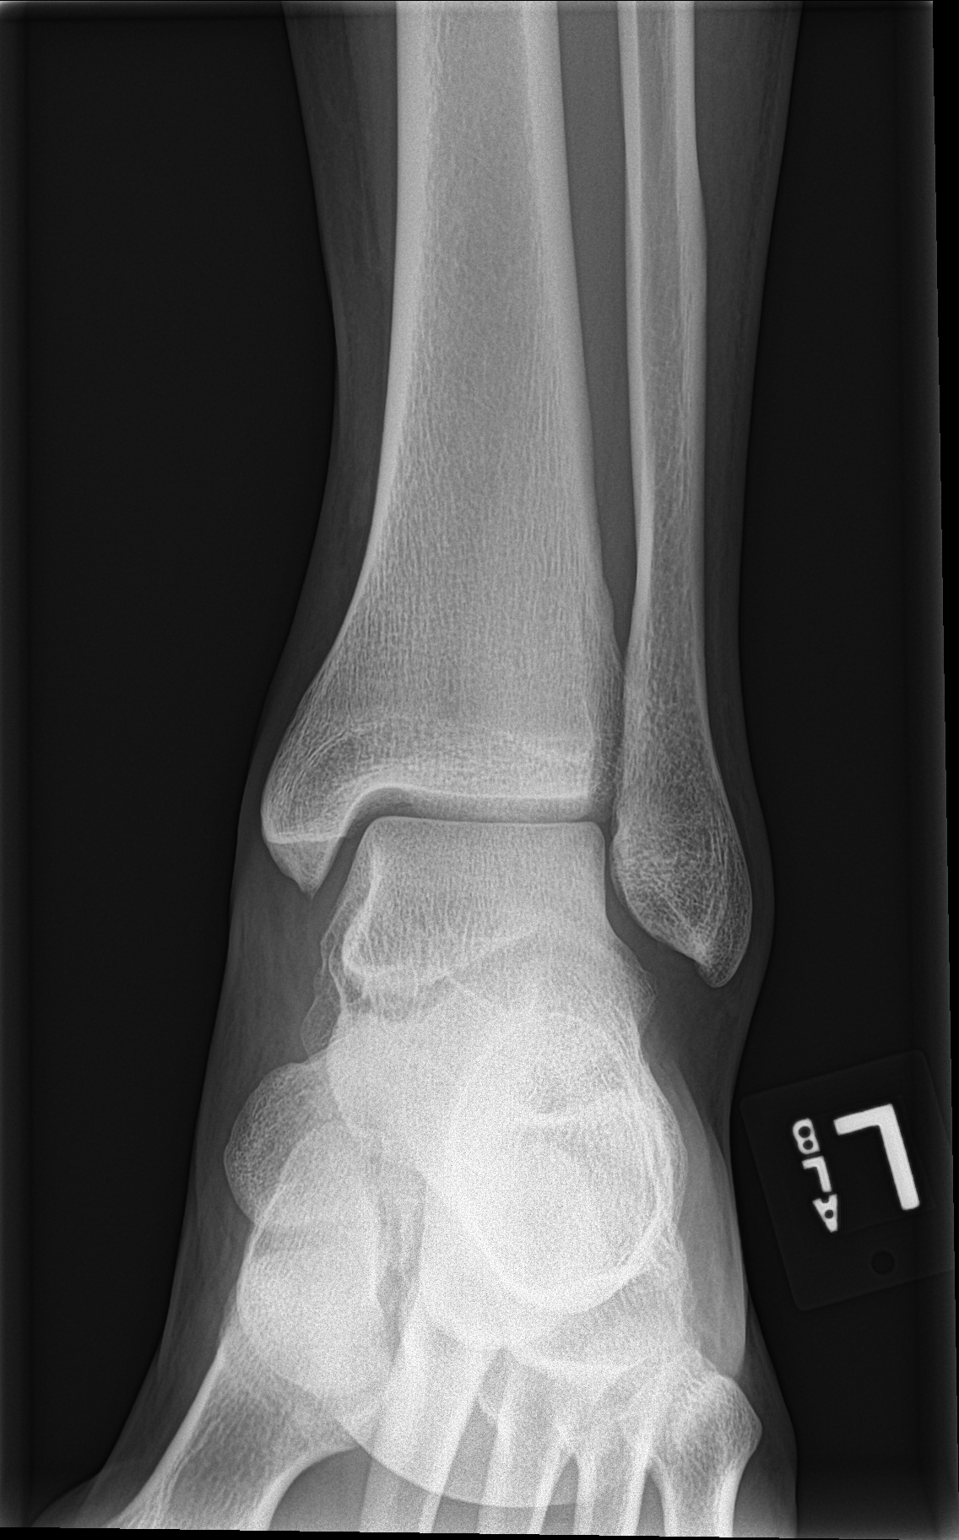

[ankle obl]
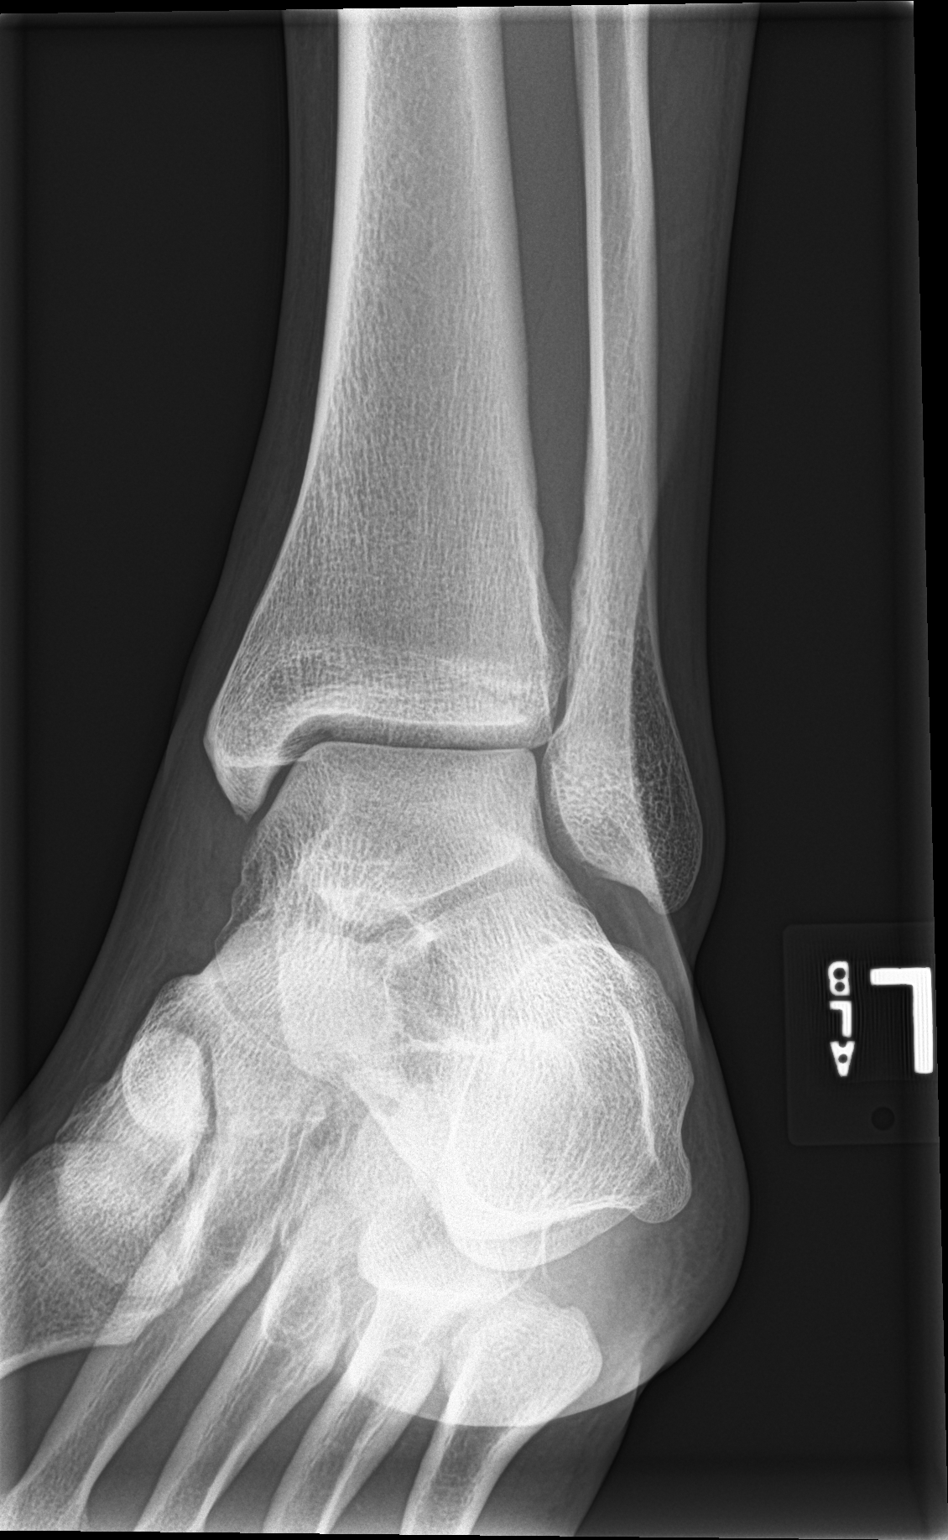

[ankle lat]
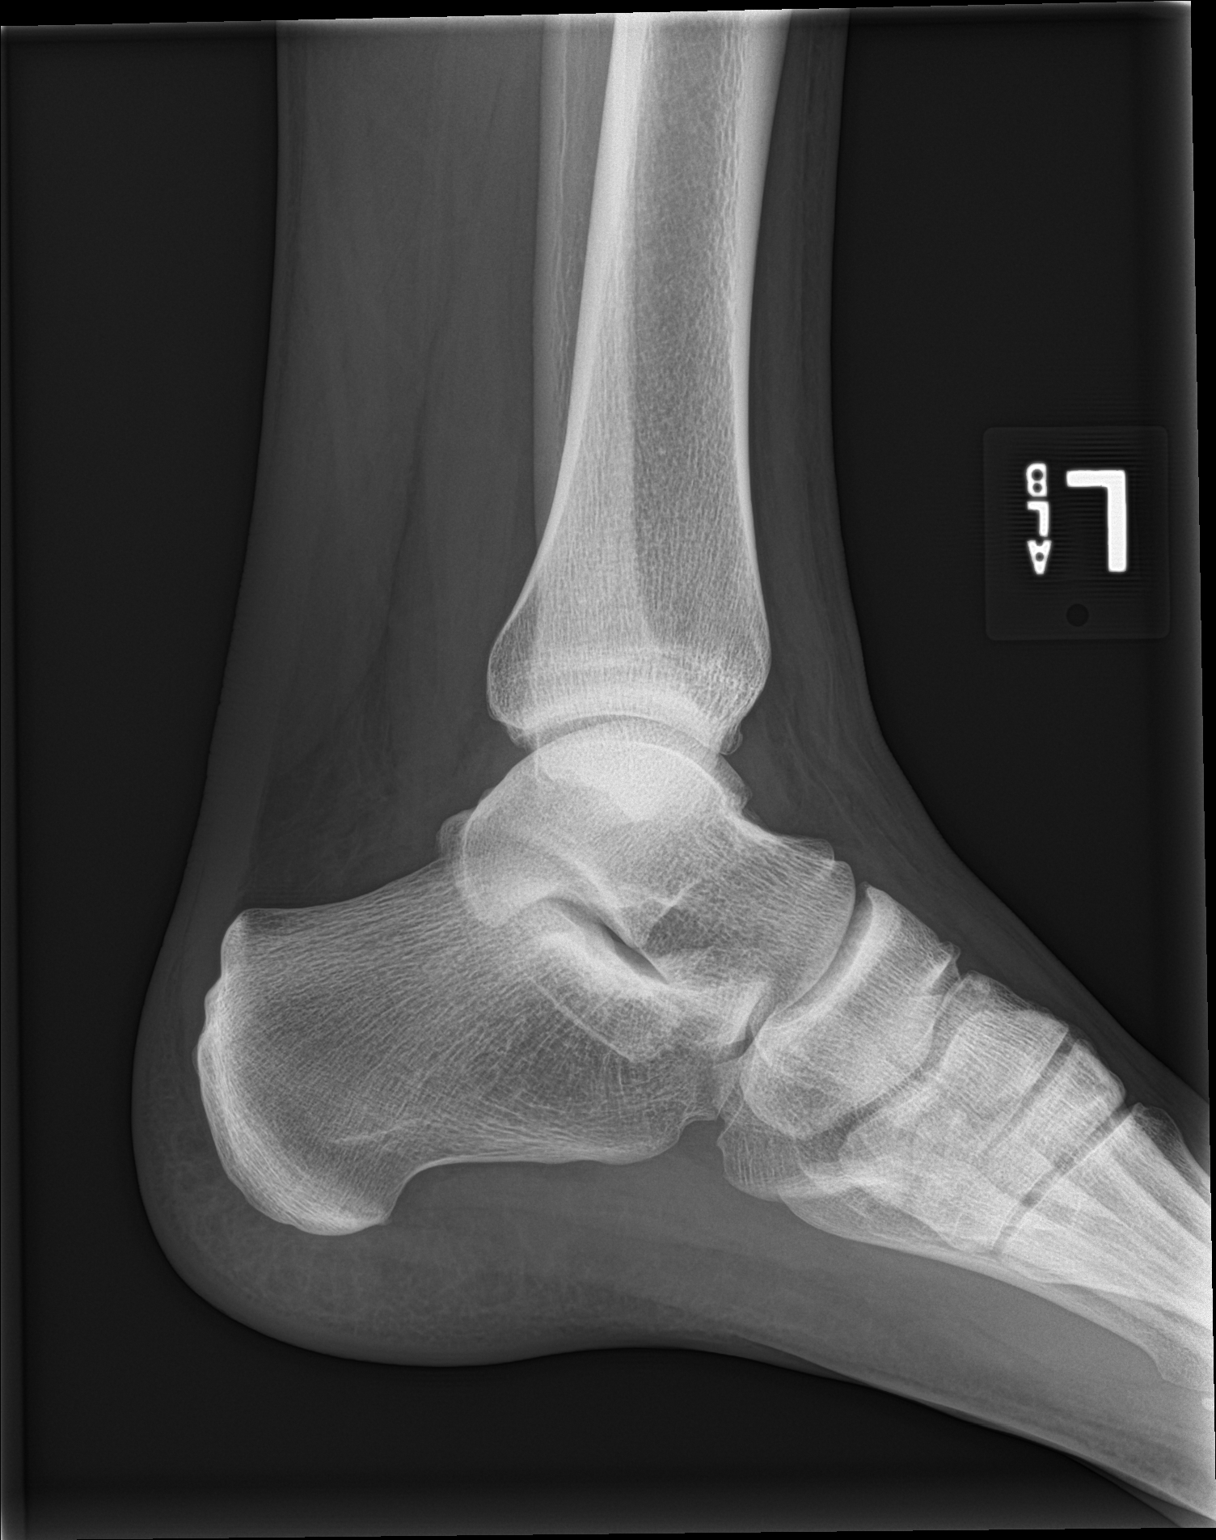

[3 of 3 positions shown; findings below may reference images not displayed]

FINDINGS: Frontal, oblique, and lateral views were obtained. No evident
fracture or joint effusion. The joint spaces appear unremarkable. No
erosive change. Ankle mortise appears intact..
IMPRESSION: No fracture or evident arthropathy.  Ankle mortise appears intact.

## 2020-07-30 IMAGING — CR DG FOOT COMPLETE 3+V*L*
3 series · 4 of 4 positions shown · non-contrast
Comparison: None.

CLINICAL DATA: Left foot pain after injury 2 months ago.

EXAM:
LEFT FOOT - COMPLETE 3+ VIEW

[foot ap]
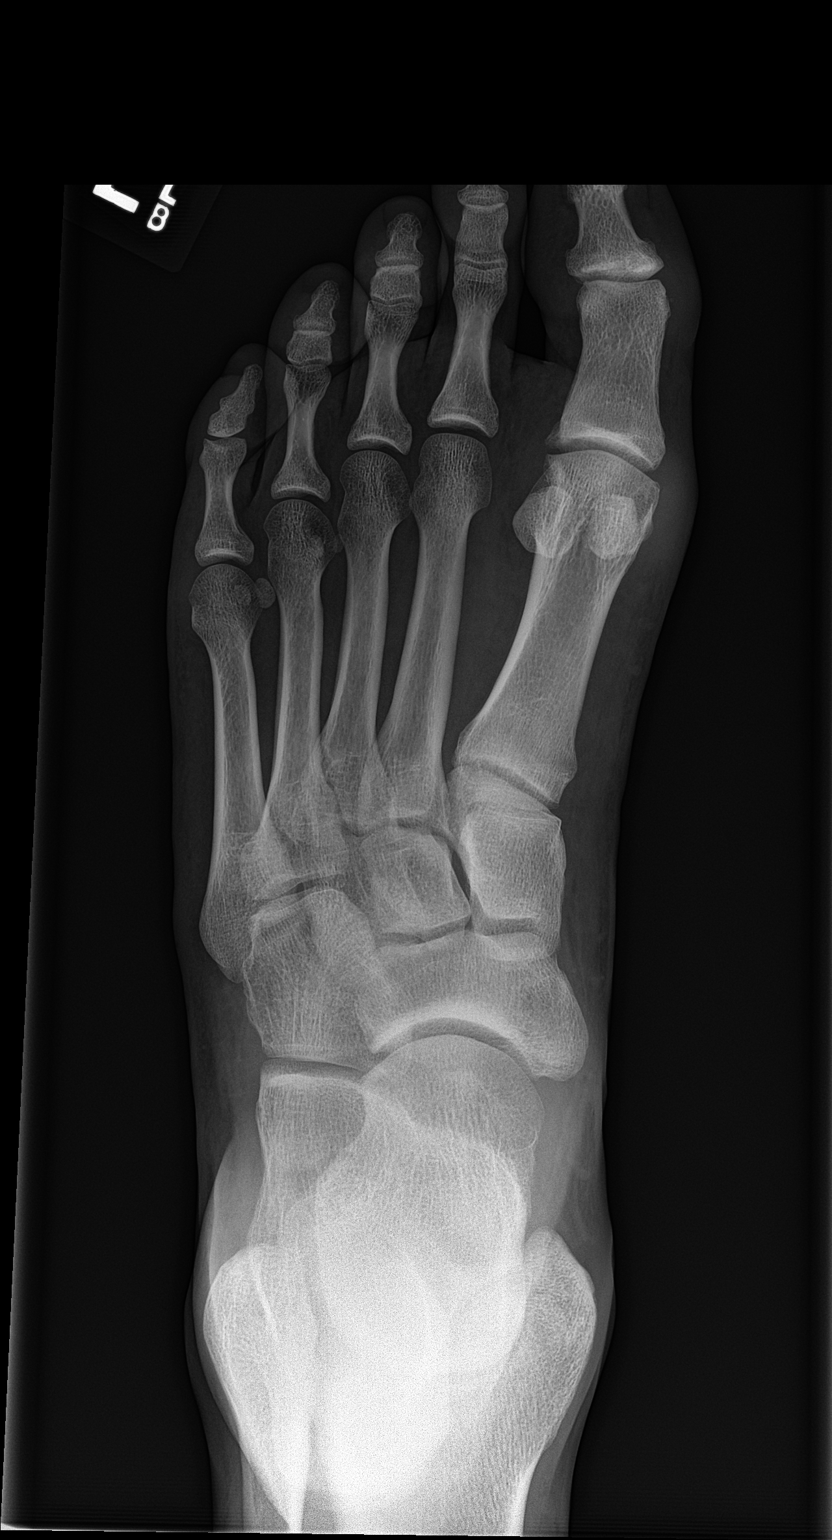

[foot obl]
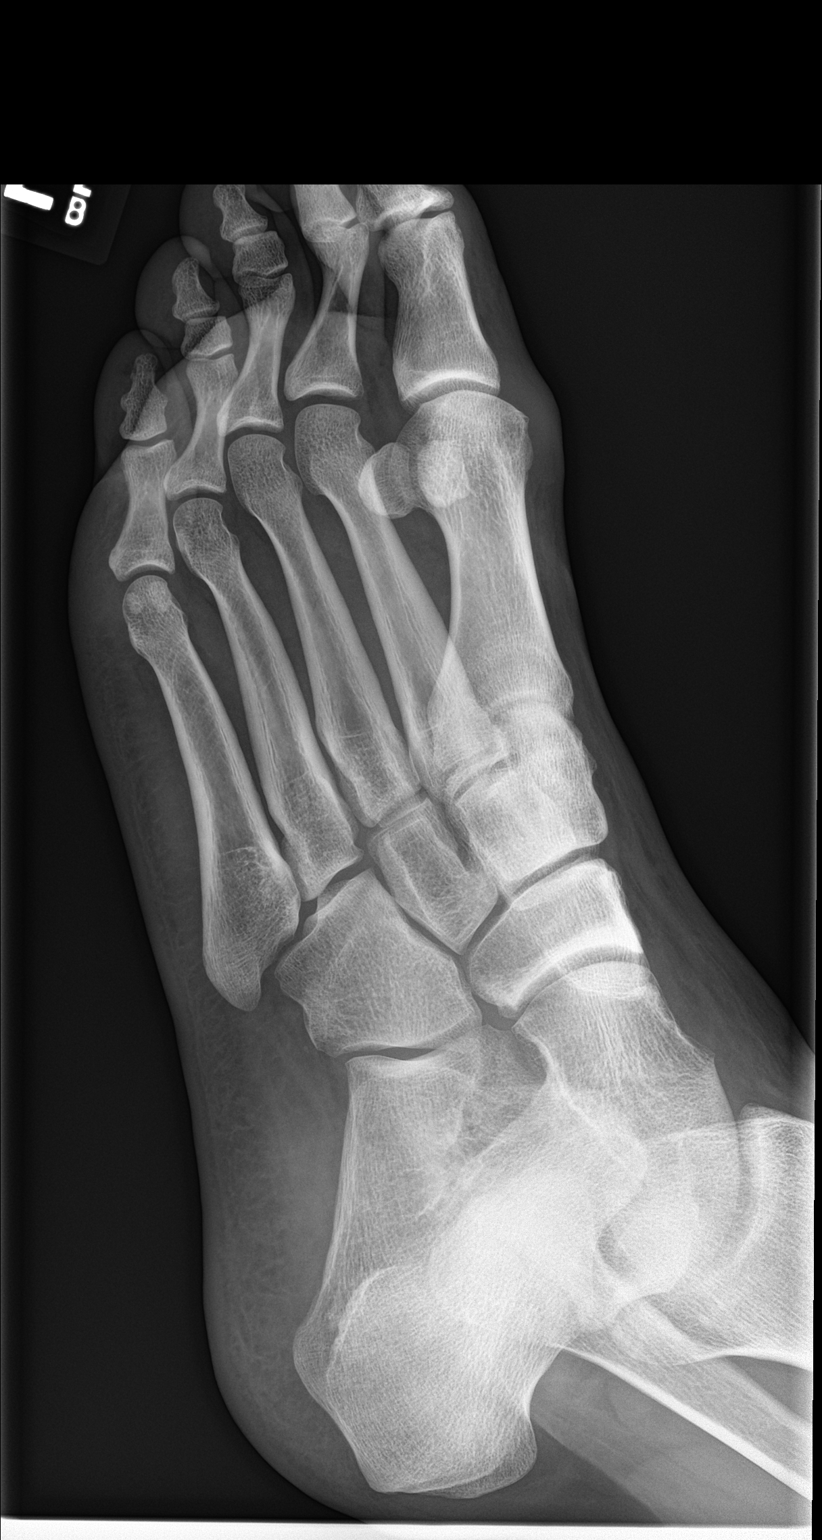

[Series 3: foot lat · 0.14mm/px · 2 of 2 slices shown]
[im 1/2]
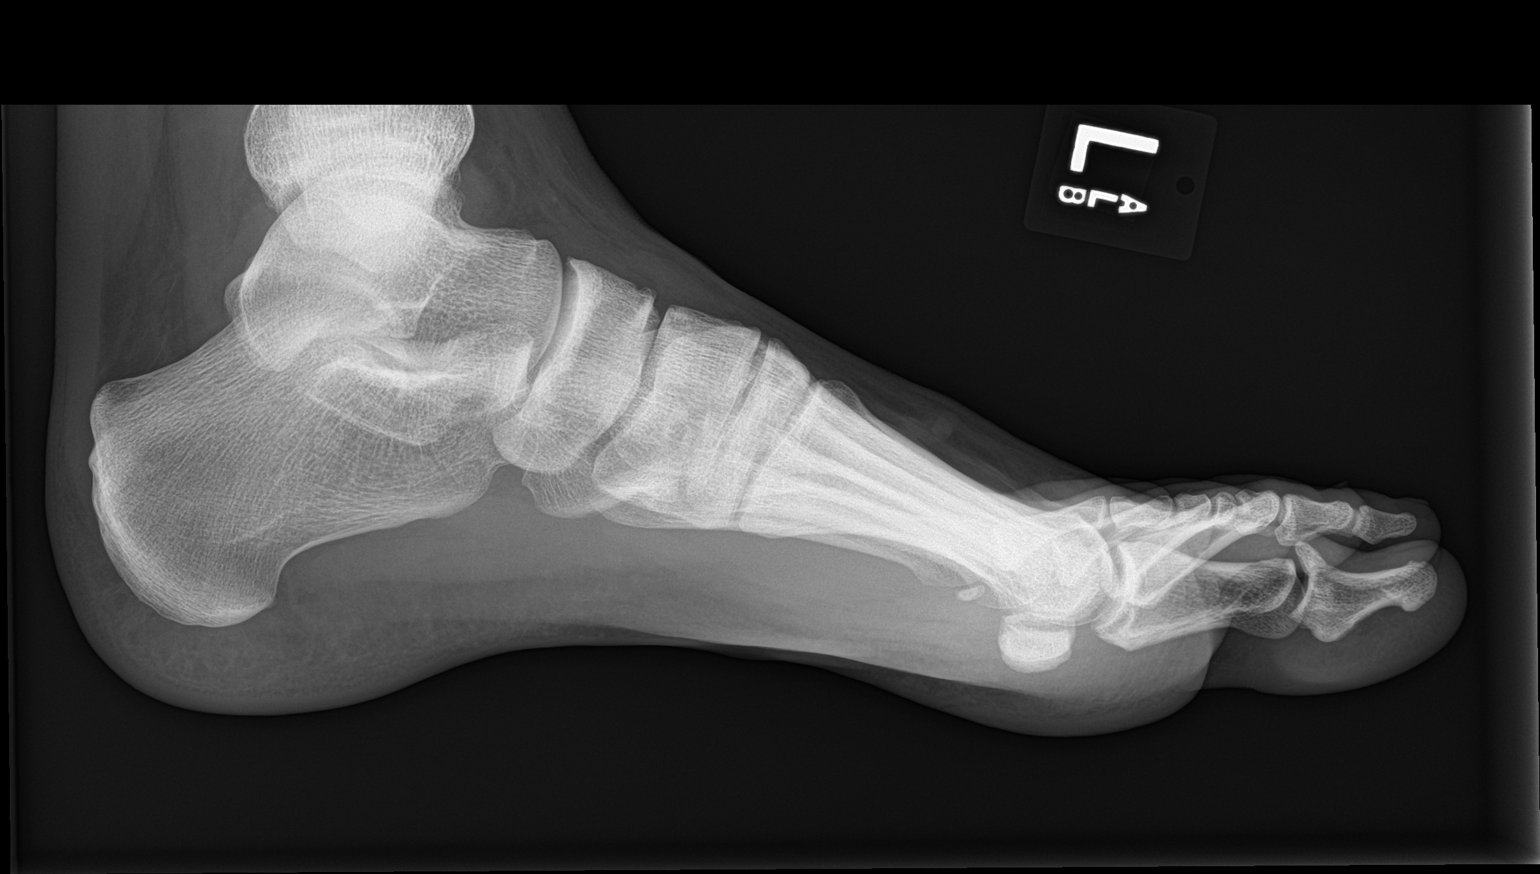
[im 2/2]
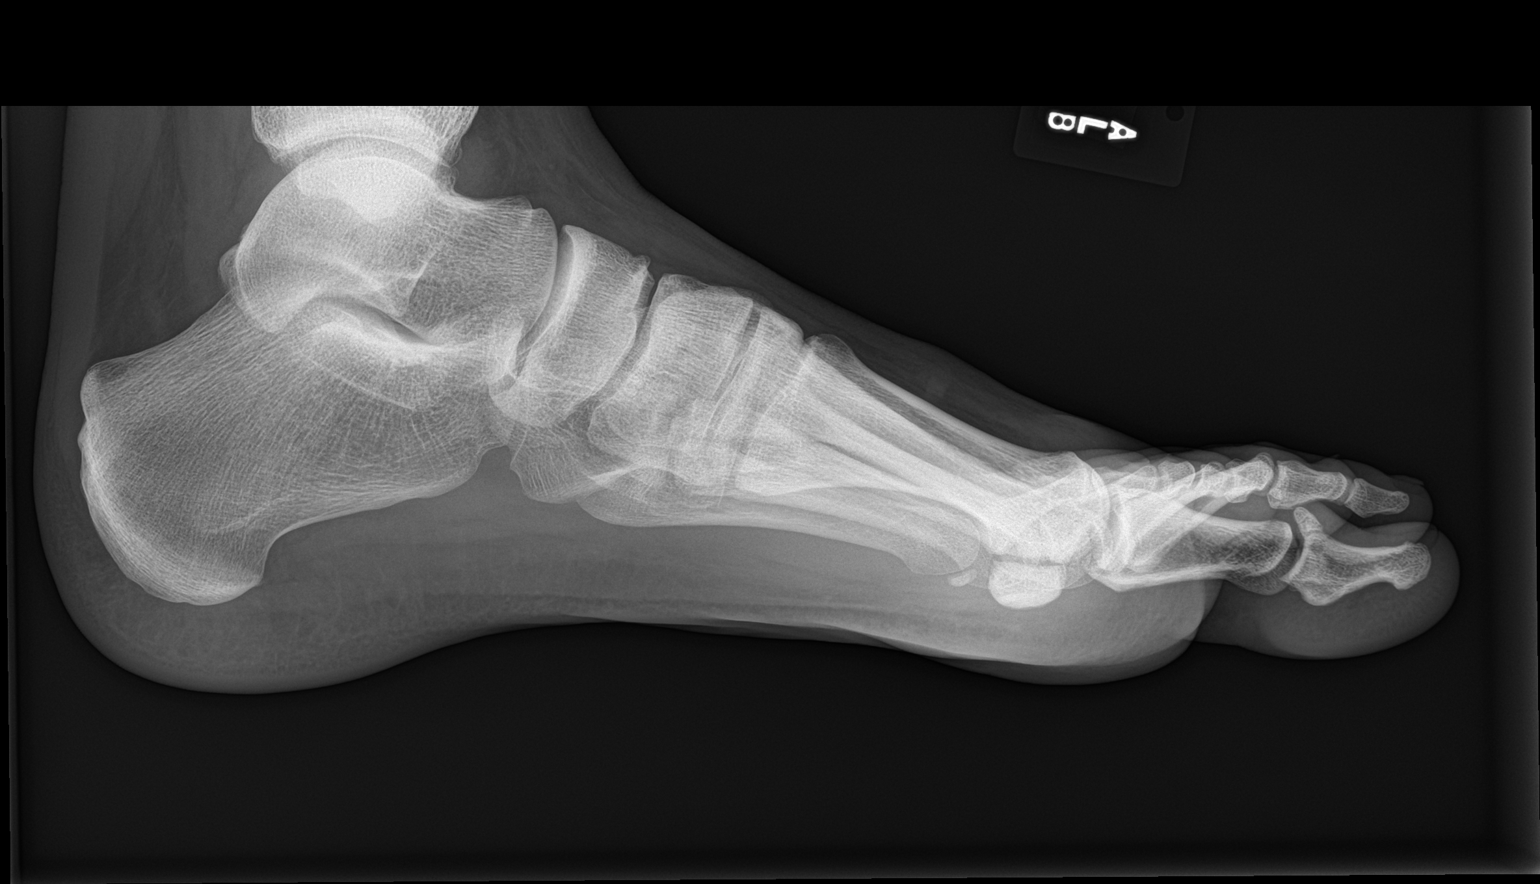

[4 of 4 positions shown; findings below may reference images not displayed]

FINDINGS: There is no evidence of fracture or dislocation. There is no
evidence of arthropathy or other focal bone abnormality. Soft
tissues are unremarkable.
IMPRESSION: Negative.

## 2023-05-01 NOTE — Progress Notes (Signed)
 Jonathon Norris is a 32 y.o. male that comes today for the following problem(s):   Chief Complaint  Patient presents with   Follow-up   Hypertension    HPI: Patient in the office for follow-up of chronic medical conditions including anxiety, hypertension and GERD.  He states he is feeling well and is without acute complaint or concern.  He notes his anxiety remains well-controlled with the sertraline.  He denies sleep disturbance, focus difficulty or suicidal/homicidal thoughts.  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  He reports the majority of his home blood pressures in the 120s-130s/80s with occasionally higher.  He feels the stress of the situation makes his blood pressure go up.  He has never had his monitor checked against our readings.  He denies headaches, vision changes, speech problems, focal neurologic symptoms, chest pain, shortness of breath or palpitations.  He states his reflux remains well-controlled with the omeprazole and he denies swallowing problems or early satiety.  He denies side effect problems with his medications.   Goals      Maintain health/healthy lifestyle        Patient Active Problem List  Diagnosis   Atypical chest pain   History of tachycardia     Past Medical History:  Diagnosis Date   Allergic rhinitis    Angina pectoris (CMS-HCC) 12/25/2014   GERD (gastroesophageal reflux disease)      No past surgical history on file.  Social History   Socioeconomic History   Marital status: Single  Tobacco Use   Smoking status: Never   Smokeless tobacco: Current   Tobacco comments:    1 can of dip once a wk  Vaping Use   Vaping status: Never Used  Substance and Sexual Activity   Alcohol use: Yes    Alcohol/week: 0.0 standard drinks of alcohol    Comment: Occasional   Drug use: No   Sexual activity: Yes   Social Drivers of Health    Received from [facility], [facility]   Transportation Needs   Received [facility], [facility]   Social Connections    Family History  Problem Relation Name Age of Onset   Anxiety Mother     Anxiety Maternal Aunt     Anxiety Maternal Uncle     High blood pressure (Hypertension) Maternal Uncle     Anxiety Maternal Grandmother       Allergies  Allergen Reactions   Amoxicillin Other (See Comments)    unknown    Prior to Admission medications   Medication Sig Taking? Last Dose  fluticasone (FLONASE) 50 mcg/actuation nasal spray Place 1 spray into both nostrils as needed Reported on 08/10/2015 Yes PRN Not Currently Taking  omeprazole (PRILOSEC) 20 MG DR capsule take 1 capsule by mouth every day Yes Taking  sertraline (ZOLOFT) 100 MG tablet TAKE 1 AND 1/2 TABLETS BY MOUTH ONCE DAILY Yes Taking  valsartan-hydroCHLOROthiazide (DIOVAN-HCT) 160-12.5 mg tablet Take 1 tablet by mouth once daily       Objective:  BP (!) 138/98 (BP Location: Left upper arm, Patient Position: Sitting, BP Cuff Size: Large Adult)   Pulse 76   Ht 190.5 cm (6' 3)   Wt (!) 128.8 kg (284 lb)   SpO2 96%   BMI 35.50 kg/m   Physical Examination:  GENERAL:  The patient is alert, oriented and in no  acute distress.  Obese HEENT:  Head is normocephalic/atraumatic.  Pupils equal, round and reactive to light and accommodation.  Extraocular movements intact.  Nose and throat are clear. NECK/LYMPHATICS:  Supple without thyromegaly or lymphadenopathy.   RESPIRATORY:  Chest wall is within normal limits.   Clear to auscultation and percussion.   CARDIAC:  Regular rate and rhythm, normal S1 and S2 without murmurs, rubs or gallops.   VASCULAR:  Distal pulses 2+.   GASTROINTESTINAL  Soft. No organomegaly or tenderness found.    MUSCULOSKELETAL:  No cyanosis, clubbing or edema noted.   NEUROLOGIC:  The patient is alert and oriented x 3.  No focal  deficits. PSYCHIATRIC: Normal mood and affect      A/P   Diagnoses and all orders for this visit:  Anxiety, generalized - stable  Essential hypertension - uncontrolled -     valsartan-hydroCHLOROthiazide (DIOVAN-HCT) 160-12.5 mg tablet; Take 1 tablet by mouth once daily  Gastroesophageal reflux disease without esophagitis - stable  Rx for increased dose of valsartan-hydrochlorothiazide to 160-12.5 mg daily Continue current medications - patient instructed to have the pharmacy contact our office between visits for refills Counseled regarding diet and exercise - encourage to make healthy food choices and sensible portion sizes.  Encouraged regular aerobic activity 3-4 days per week. Reviewed most recent labs with patient.   Avoid triggers for reflux Continue to work on coping mechanisms for anxiety Monitor blood pressures regularly and bring readings to the office at 1 month follow-up Come to the office next week with home monitor for blood pressure check  Return in about 4 weeks (around 05/29/2023) for Regular follow up.

## 2023-05-08 NOTE — Progress Notes (Signed)
 Pt came in for a blood pressure reading after increasing valsartan/ HCTZ. Home readings have been averaging 137/ 82. Reading today is 138/96. Second reading 138/90.

## 2023-08-15 ENCOUNTER — Other Ambulatory Visit: Payer: Self-pay

## 2024-03-24 NOTE — Progress Notes (Signed)
 Jonathon Norris is a 33 y.o. male that comes today for the following problem(s):   Chief Complaint  Patient presents with   Follow-up    HPI: Patient in the office for follow-up of chronic medical conditions including anxiety, hypertension and GERD.  He states he is feeling well and is without acute complaint or concern.  His mood remains stable with the sertraline and coping mechanisms.  He denies sleep disturbance, focus difficulty or suicidal/homicidal thoughts.  His blood pressures have remained well-controlled with the valsartan-hydrochlorothiazide.  He denies headaches, vision changes, speech problems, focal neurologic symptoms, chest pain, shortness of breath or palpitations.  He is trying to eat a healthy diet and be active.  His reflux remains well-controlled with omeprazole and avoidance of triggers.  He denies swallowing problems or early satiety.  He denies side effect problems with his medications.   Goals      Maintain health/healthy lifestyle        Patient Active Problem List  Diagnosis   Atypical chest pain   History of tachycardia     Past Medical History:  Diagnosis Date   Allergic rhinitis    Angina pectoris 12/25/2014   GERD (gastroesophageal reflux disease)      No past surgical history on file.  Social History   Socioeconomic History   Marital status: Single  Tobacco Use   Smoking status: Never   Smokeless tobacco: Current   Tobacco comments:    1 can of dip once a wk  Vaping Use   Vaping status: Never Used  Substance and Sexual Activity   Alcohol use: Yes    Alcohol/week: 0.0 standard drinks of alcohol    Comment: Occasional   Drug use: No   Sexual activity: Yes   Social Drivers of Health    Received from [facility]   Transportation Needs   Received from [facility]   Social Connections    Family History  Problem Relation Name Age of Onset   Anxiety Mother     Anxiety Maternal Aunt      Anxiety Maternal Uncle     High blood pressure (Hypertension) Maternal Uncle     Anxiety Maternal Grandmother       Allergies  Allergen Reactions   Amoxicillin Other (See Comments)    unknown    Prior to Admission medications  Medication Sig Taking? Last Dose  fluticasone (FLONASE) 50 mcg/actuation nasal spray Place 1 spray into both nostrils as needed Reported on 08/10/2015 Yes PRN Not Currently Taking  omeprazole (PRILOSEC) 20 MG DR capsule TAKE 1 CAPSULE BY MOUTH EVERY DAY Yes Taking  sertraline (ZOLOFT) 100 MG tablet Take 1.5 tablets (150 mg total) by mouth once daily Yes Taking  valsartan-hydroCHLOROthiazide (DIOVAN-HCT) 160-12.5 mg tablet Take 1 tablet by mouth once daily Yes Taking     Objective:  BP (!) 130/92 (BP Location: Left upper arm, Patient Position: Sitting, BP Cuff Size: Large Adult)   Pulse 88   Ht 190.5 cm (6' 3)   Wt (!) 128.4 kg (283 lb)   SpO2 97%   BMI 35.37 kg/m   Physical Examination:  GENERAL:  The patient is alert, oriented and in no acute distress.  Obese HEENT:  Head is normocephalic/atraumatic.  Pupils equal, round and reactive to light and accommodation.  Extraocular movements intact.  Nose and throat are clear. NECK/LYMPHATICS:  Supple without thyromegaly or lymphadenopathy.   RESPIRATORY:  Chest wall is within normal limits.   Clear to  auscultation and percussion.   CARDIAC:  Regular rate and rhythm, normal S1 and S2 without murmurs, rubs or gallops.   VASCULAR:  Distal pulses 2+.   GASTROINTESTINAL  Soft. No organomegaly or tenderness found.    MUSCULOSKELETAL:  No cyanosis, clubbing or edema noted.   NEUROLOGIC:  The patient is alert and oriented x 3.  No focal deficits. PSYCHIATRIC: Normal mood and affect      A/P   Diagnoses and all orders for this visit:  Anxiety, generalized - stable  Essential hypertension - stable -     CBC w/auto Differential (3 Part); Future -     Comprehensive Metabolic Panel (CMP); Future -      Lipid Panel w/calc LDL; Future  Gastroesophageal reflux disease without esophagitis - stable  Depression screening (Z13.31) -     Depression Screen -(PHQ- 2/9, BDI)  Continue current medications - patient instructed to have the pharmacy contact our office between visits for refills Counseled regarding diet and exercise - encourage to make healthy food choices and sensible portion sizes.  Encouraged regular aerobic activity 3-4 days per week. Reviewed most recent labs with patient.  Await above ordered labs Avoid triggers for reflux Continue to monitor blood pressures regularly Continue to work on coping mechanisms for anxiety  Return in about 6 months (around 09/21/2024) for Regular follow up, Schedule labs, Fasting.
